# Patient Record
Sex: Female | Born: 1950 | Race: White | Marital: Married | Smoking: Never smoker
Health system: Southern US, Community
[De-identification: ages and names within clinical notes are randomized; demographics above are authoritative.]

## PROBLEM LIST (undated history)

## (undated) DIAGNOSIS — F32A Depression, unspecified: Secondary | ICD-10-CM

## (undated) DIAGNOSIS — I499 Cardiac arrhythmia, unspecified: Secondary | ICD-10-CM

## (undated) DIAGNOSIS — R42 Dizziness and giddiness: Secondary | ICD-10-CM

## (undated) DIAGNOSIS — A4902 Methicillin resistant Staphylococcus aureus infection, unspecified site: Secondary | ICD-10-CM

## (undated) DIAGNOSIS — F419 Anxiety disorder, unspecified: Secondary | ICD-10-CM

## (undated) DIAGNOSIS — F329 Major depressive disorder, single episode, unspecified: Secondary | ICD-10-CM

## (undated) DIAGNOSIS — M797 Fibromyalgia: Secondary | ICD-10-CM

## (undated) DIAGNOSIS — D649 Anemia, unspecified: Secondary | ICD-10-CM

## (undated) HISTORY — PX: ABDOMINAL HYSTERECTOMY: SHX81

## (undated) HISTORY — PX: HAND SURGERY: SHX662

## (undated) HISTORY — PX: INCONTINENCE SURGERY: SHX676

## (undated) HISTORY — PX: DENTAL SURGERY: SHX609

---

## 2013-03-11 ENCOUNTER — Other Ambulatory Visit: Payer: Self-pay | Admitting: Physical Medicine and Rehabilitation

## 2013-03-11 DIAGNOSIS — M5136 Other intervertebral disc degeneration, lumbar region: Secondary | ICD-10-CM

## 2013-03-11 DIAGNOSIS — M545 Low back pain, unspecified: Secondary | ICD-10-CM

## 2013-03-12 ENCOUNTER — Other Ambulatory Visit: Payer: Self-pay | Admitting: Physical Medicine and Rehabilitation

## 2013-03-12 DIAGNOSIS — Z139 Encounter for screening, unspecified: Secondary | ICD-10-CM

## 2013-03-16 ENCOUNTER — Other Ambulatory Visit: Payer: Self-pay

## 2013-03-20 ENCOUNTER — Ambulatory Visit
Admission: RE | Admit: 2013-03-20 | Discharge: 2013-03-20 | Disposition: A | Discharge status: Home / Self Care | Payer: Federal, State, Local not specified - PPO | Source: Ambulatory Visit | Attending: Physical Medicine and Rehabilitation | Admitting: Physical Medicine and Rehabilitation

## 2013-03-20 DIAGNOSIS — M545 Low back pain, unspecified: Secondary | ICD-10-CM

## 2013-03-20 DIAGNOSIS — Z139 Encounter for screening, unspecified: Secondary | ICD-10-CM

## 2013-03-20 DIAGNOSIS — M5136 Other intervertebral disc degeneration, lumbar region: Secondary | ICD-10-CM

## 2014-10-17 IMAGING — CR DG ORBITS FOR FOREIGN BODY
2 series · 2 of 2 positions shown · non-contrast
Comparison: None

CLINICAL DATA: Pre MRI.  History of exposure to metal.

ORBITS FOR FOREIGN BODY - 2 VIEW

[view not recorded (1 of 2)]
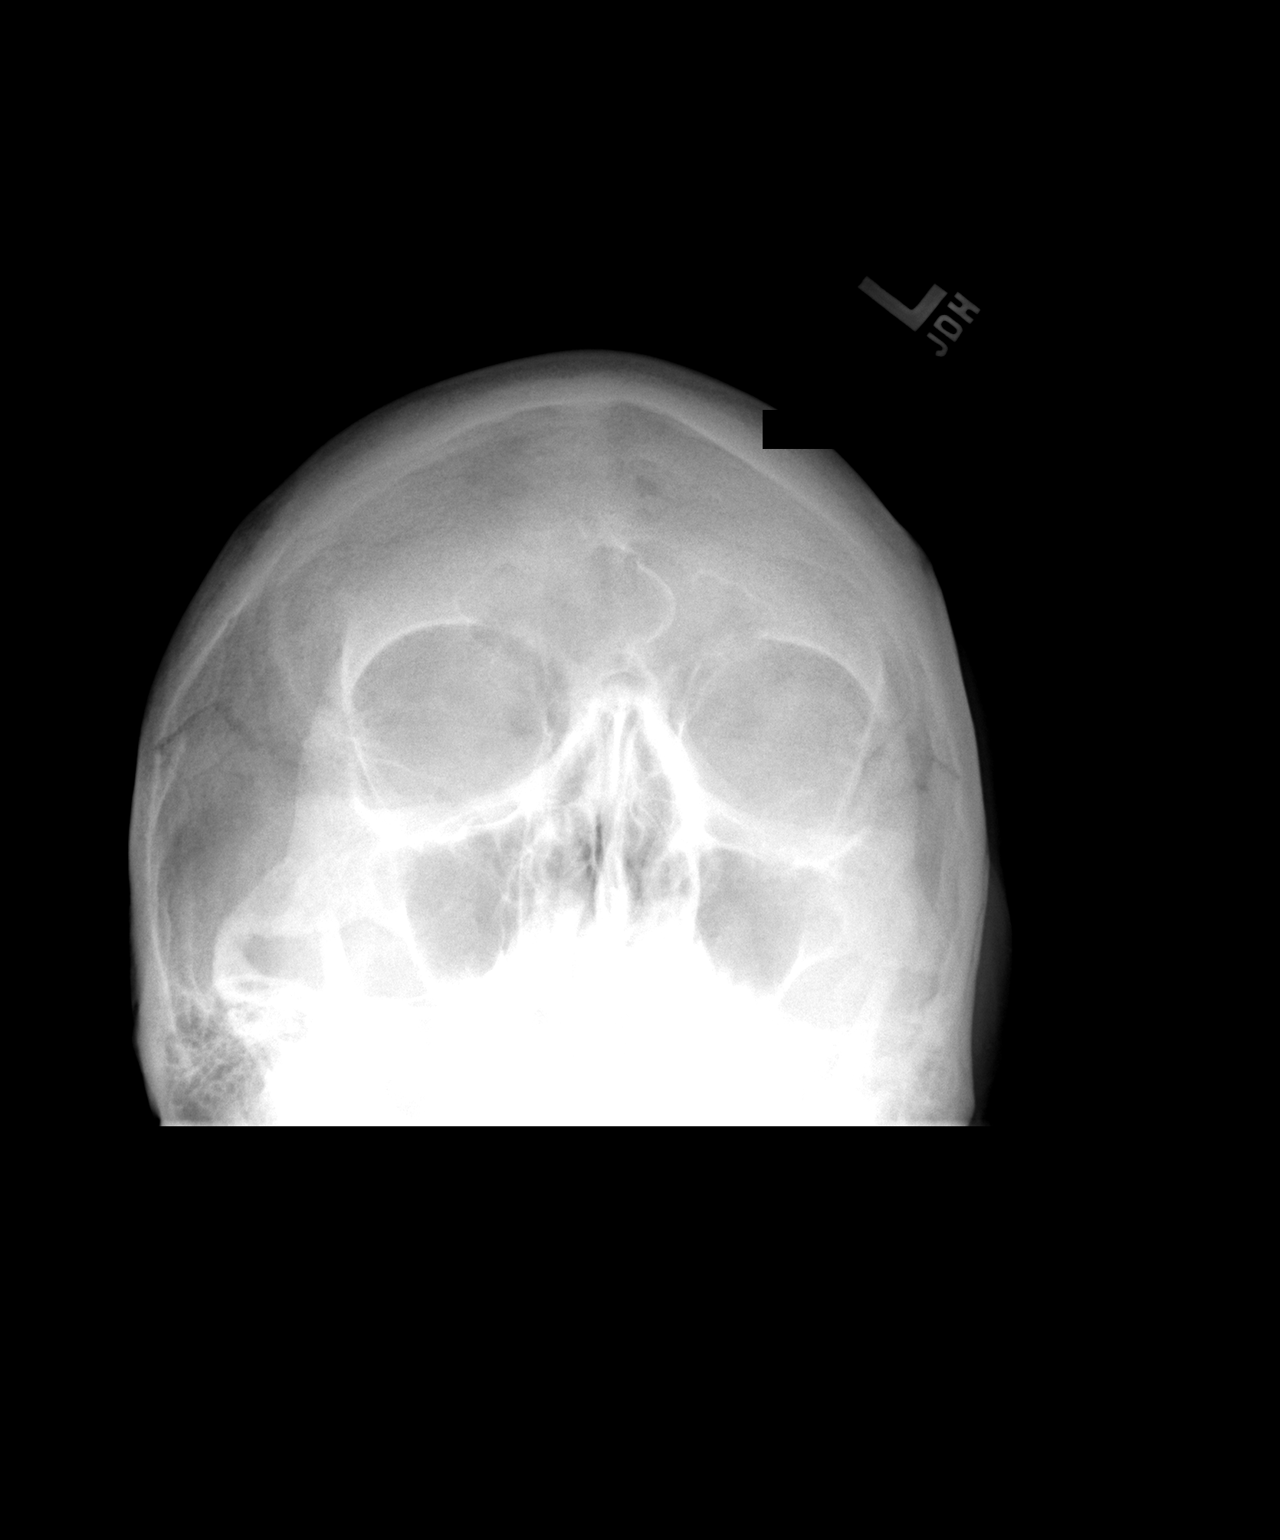

[view not recorded (2 of 2)]
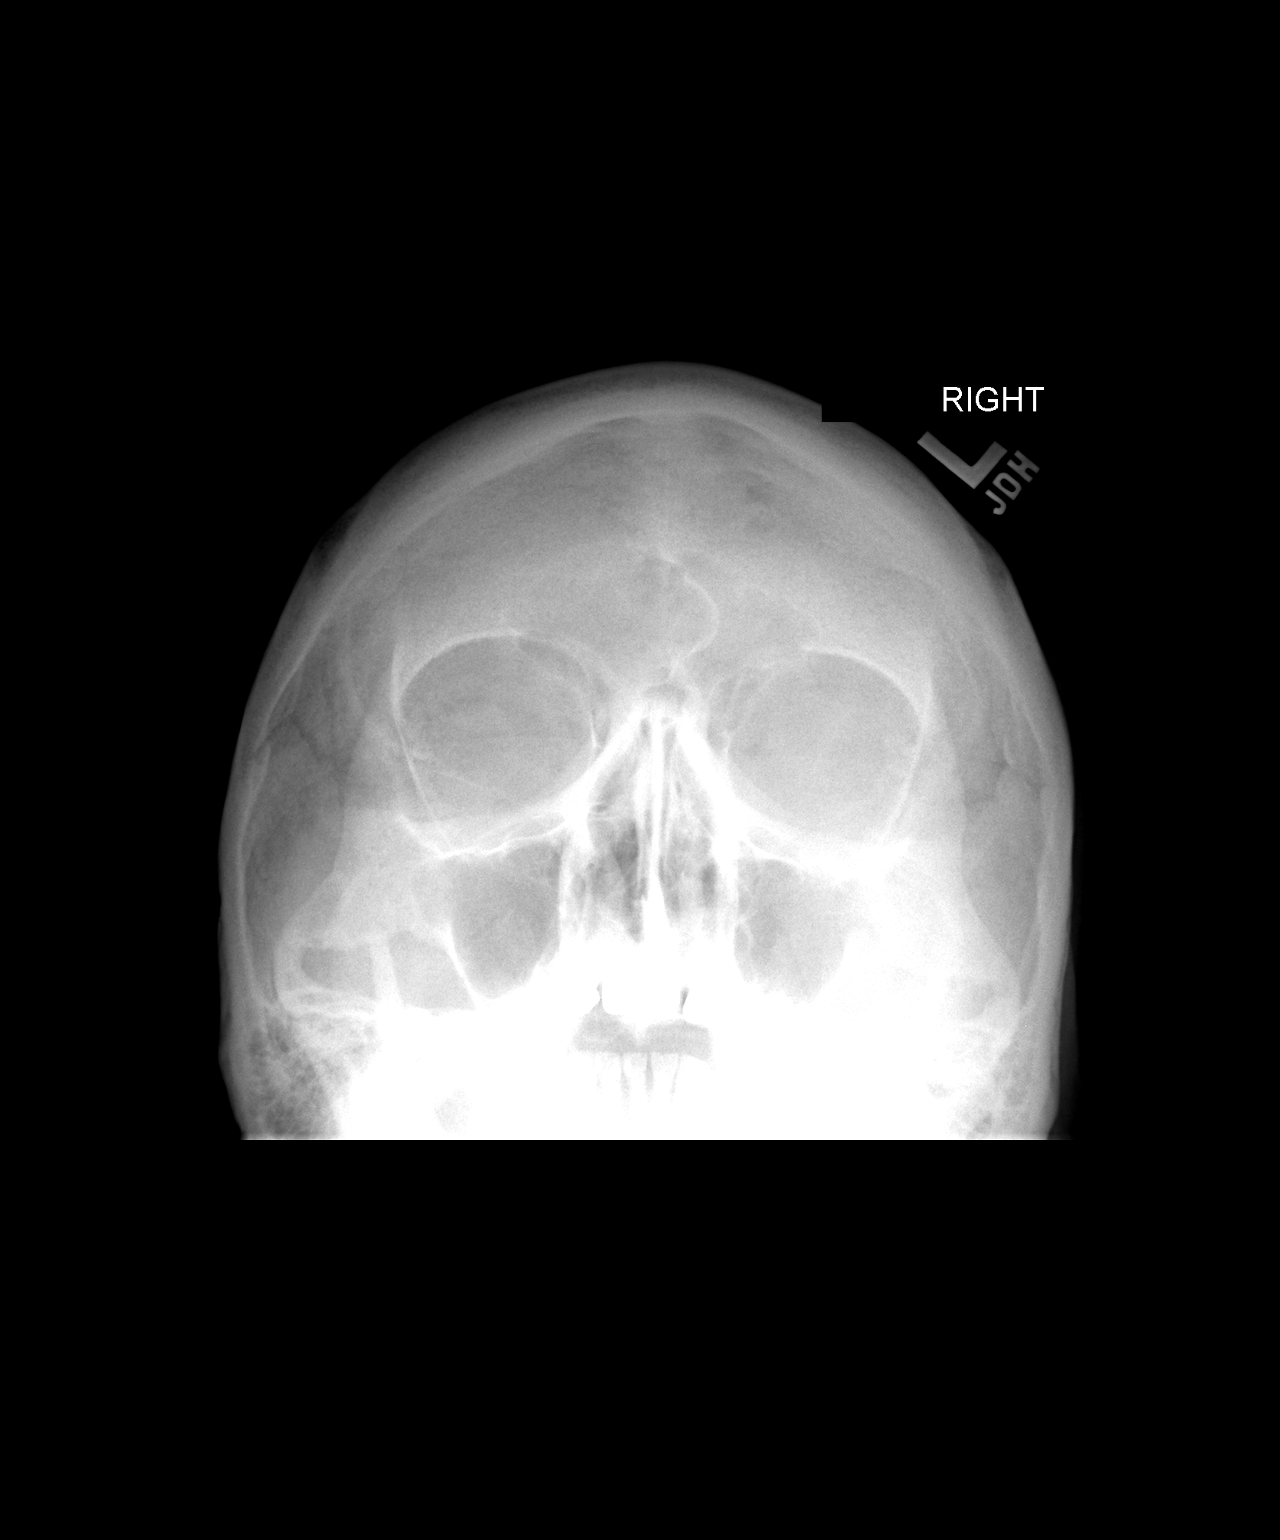

[2 of 2 positions shown; findings below may reference images not displayed]

FINDINGS: No evidence of radiopaque metallic foreign body
projecting over the orbits.  Visualized bony structures
unremarkable.
IMPRESSION: No evidence of radiopaque foreign body within the orbits.

## 2018-01-08 ENCOUNTER — Other Ambulatory Visit: Payer: Self-pay | Admitting: Orthopedic Surgery

## 2018-01-30 ENCOUNTER — Encounter (HOSPITAL_BASED_OUTPATIENT_CLINIC_OR_DEPARTMENT_OTHER)
Admission: RE | Disposition: A | Discharge status: Home / Self Care | Payer: Self-pay | Source: Ambulatory Visit | Attending: Orthopedic Surgery

## 2018-01-30 ENCOUNTER — Encounter (HOSPITAL_BASED_OUTPATIENT_CLINIC_OR_DEPARTMENT_OTHER): Payer: Self-pay | Admitting: *Deleted

## 2018-01-30 ENCOUNTER — Ambulatory Visit (HOSPITAL_BASED_OUTPATIENT_CLINIC_OR_DEPARTMENT_OTHER)
Admission: RE | Admit: 2018-01-30 | Discharge: 2018-01-30 | Disposition: A | Discharge status: Home / Self Care | Payer: Medicare Other | Source: Ambulatory Visit | Attending: Orthopedic Surgery | Admitting: Orthopedic Surgery

## 2018-01-30 ENCOUNTER — Ambulatory Visit (HOSPITAL_BASED_OUTPATIENT_CLINIC_OR_DEPARTMENT_OTHER): Payer: Medicare Other | Admitting: Anesthesiology

## 2018-01-30 ENCOUNTER — Other Ambulatory Visit: Payer: Self-pay

## 2018-01-30 DIAGNOSIS — R42 Dizziness and giddiness: Secondary | ICD-10-CM | POA: Diagnosis not present

## 2018-01-30 DIAGNOSIS — Z6833 Body mass index (BMI) 33.0-33.9, adult: Secondary | ICD-10-CM | POA: Insufficient documentation

## 2018-01-30 DIAGNOSIS — M797 Fibromyalgia: Secondary | ICD-10-CM | POA: Insufficient documentation

## 2018-01-30 DIAGNOSIS — G5601 Carpal tunnel syndrome, right upper limb: Secondary | ICD-10-CM | POA: Diagnosis not present

## 2018-01-30 DIAGNOSIS — F418 Other specified anxiety disorders: Secondary | ICD-10-CM | POA: Diagnosis not present

## 2018-01-30 DIAGNOSIS — M180 Bilateral primary osteoarthritis of first carpometacarpal joints: Secondary | ICD-10-CM | POA: Insufficient documentation

## 2018-01-30 DIAGNOSIS — M19041 Primary osteoarthritis, right hand: Secondary | ICD-10-CM | POA: Insufficient documentation

## 2018-01-30 DIAGNOSIS — E669 Obesity, unspecified: Secondary | ICD-10-CM | POA: Insufficient documentation

## 2018-01-30 HISTORY — DX: Dizziness and giddiness: R42

## 2018-01-30 HISTORY — DX: Major depressive disorder, single episode, unspecified: F32.9

## 2018-01-30 HISTORY — DX: Cardiac arrhythmia, unspecified: I49.9

## 2018-01-30 HISTORY — PX: CARPAL TUNNEL RELEASE: SHX101

## 2018-01-30 HISTORY — DX: Anxiety disorder, unspecified: F41.9

## 2018-01-30 HISTORY — DX: Anemia, unspecified: D64.9

## 2018-01-30 HISTORY — DX: Depression, unspecified: F32.A

## 2018-01-30 HISTORY — DX: Methicillin resistant Staphylococcus aureus infection, unspecified site: A49.02

## 2018-01-30 HISTORY — DX: Fibromyalgia: M79.7

## 2018-01-30 SURGERY — CARPAL TUNNEL RELEASE
Anesthesia: Regional | Site: Wrist | Laterality: Right

## 2018-01-30 MED ORDER — LIDOCAINE HCL (PF) 0.5 % IJ SOLN
INTRAMUSCULAR | Status: DC | PRN
  Administered 2018-01-30: 35 mg via INTRAVENOUS

## 2018-01-30 MED ORDER — 0.9 % SODIUM CHLORIDE (POUR BTL) OPTIME
TOPICAL | Status: DC | PRN
  Administered 2018-01-30: 100 mL

## 2018-01-30 MED ORDER — FENTANYL CITRATE (PF) 100 MCG/2ML IJ SOLN: 25.0000 ug | INTRAMUSCULAR | Status: DC | PRN

## 2018-01-30 MED ORDER — FENTANYL CITRATE (PF) 100 MCG/2ML IJ SOLN
50.0000 ug | INTRAMUSCULAR | Status: DC | PRN
  Administered 2018-01-30: 50 ug via INTRAVENOUS

## 2018-01-30 MED ORDER — FENTANYL CITRATE (PF) 100 MCG/2ML IJ SOLN
INTRAMUSCULAR | Status: AC
  Filled 2018-01-30: qty 2

## 2018-01-30 MED ORDER — BUPIVACAINE HCL (PF) 0.25 % IJ SOLN
INTRAMUSCULAR | Status: DC | PRN
  Administered 2018-01-30: 8 mL

## 2018-01-30 MED ORDER — ONDANSETRON HCL 4 MG/2ML IJ SOLN
INTRAMUSCULAR | Status: DC | PRN
  Administered 2018-01-30: 4 mg via INTRAVENOUS

## 2018-01-30 MED ORDER — CHLORHEXIDINE GLUCONATE 4 % EX LIQD: 60.0000 mL | Freq: Once | CUTANEOUS | Status: DC

## 2018-01-30 MED ORDER — ONDANSETRON HCL 4 MG/2ML IJ SOLN
INTRAMUSCULAR | Status: AC
  Filled 2018-01-30: qty 2

## 2018-01-30 MED ORDER — ONDANSETRON HCL 4 MG/2ML IJ SOLN: 4.0000 mg | Freq: Once | INTRAMUSCULAR | Status: DC | PRN

## 2018-01-30 MED ORDER — PROPOFOL 500 MG/50ML IV EMUL
INTRAVENOUS | Status: DC | PRN
  Administered 2018-01-30: 75 ug/kg/min via INTRAVENOUS

## 2018-01-30 MED ORDER — LACTATED RINGERS IV SOLN
INTRAVENOUS | Status: DC
  Administered 2018-01-30: 10:00:00 via INTRAVENOUS

## 2018-01-30 MED ORDER — CEFAZOLIN SODIUM-DEXTROSE 2-4 GM/100ML-% IV SOLN: 2.0000 g | INTRAVENOUS | Status: DC

## 2018-01-30 MED ORDER — HYDROCODONE-ACETAMINOPHEN 5-325 MG PO TABS: 1.0000 | ORAL_TABLET | Freq: Four times a day (QID) | ORAL | 0 refills | Status: AC | PRN

## 2018-01-30 MED ORDER — CEFAZOLIN SODIUM-DEXTROSE 2-4 GM/100ML-% IV SOLN
INTRAVENOUS | Status: AC
  Filled 2018-01-30: qty 100

## 2018-01-30 MED ORDER — MIDAZOLAM HCL 2 MG/2ML IJ SOLN
INTRAMUSCULAR | Status: AC
  Filled 2018-01-30: qty 2

## 2018-01-30 MED ORDER — OXYCODONE HCL 5 MG PO TABS: 5.0000 mg | ORAL_TABLET | Freq: Once | ORAL | Status: DC | PRN

## 2018-01-30 MED ORDER — MIDAZOLAM HCL 2 MG/2ML IJ SOLN
1.0000 mg | INTRAMUSCULAR | Status: DC | PRN
  Administered 2018-01-30: 1 mg via INTRAVENOUS

## 2018-01-30 MED ORDER — SCOPOLAMINE 1 MG/3DAYS TD PT72: 1.0000 | MEDICATED_PATCH | Freq: Once | TRANSDERMAL | Status: DC | PRN

## 2018-01-30 MED ORDER — OXYCODONE HCL 5 MG/5ML PO SOLN: 5.0000 mg | Freq: Once | ORAL | Status: DC | PRN

## 2018-01-30 MED ORDER — LIDOCAINE HCL (CARDIAC) 20 MG/ML IV SOLN
INTRAVENOUS | Status: DC | PRN
  Administered 2018-01-30: 50 mg via INTRAVENOUS

## 2018-01-30 SURGICAL SUPPLY — 38 items
BLADE SURG 15 STRL LF DISP TIS (BLADE) ×1 IMPLANT
BLADE SURG 15 STRL SS (BLADE) ×1
BNDG COHESIVE 3X5 TAN STRL LF (GAUZE/BANDAGES/DRESSINGS) ×2 IMPLANT
BNDG ESMARK 4X9 LF (GAUZE/BANDAGES/DRESSINGS) ×2 IMPLANT
BNDG GAUZE ELAST 4 BULKY (GAUZE/BANDAGES/DRESSINGS) ×2 IMPLANT
CHLORAPREP W/TINT 26ML (MISCELLANEOUS) ×2 IMPLANT
CORD BIPOLAR FORCEPS 12FT (ELECTRODE) ×2 IMPLANT
COVER BACK TABLE 60X90IN (DRAPES) ×2 IMPLANT
COVER MAYO STAND STRL (DRAPES) ×2 IMPLANT
CUFF TOURNIQUET SINGLE 18IN (TOURNIQUET CUFF) ×2 IMPLANT
DRAPE EXTREMITY T 121X128X90 (DRAPE) ×2 IMPLANT
DRAPE SURG 17X23 STRL (DRAPES) ×2 IMPLANT
DRSG PAD ABDOMINAL 8X10 ST (GAUZE/BANDAGES/DRESSINGS) ×2 IMPLANT
GAUZE SPONGE 4X4 12PLY STRL (GAUZE/BANDAGES/DRESSINGS) ×2 IMPLANT
GAUZE XEROFORM 1X8 LF (GAUZE/BANDAGES/DRESSINGS) ×2 IMPLANT
GLOVE BIOGEL PI IND STRL 7.0 (GLOVE) ×2 IMPLANT
GLOVE BIOGEL PI IND STRL 7.5 (GLOVE) ×1 IMPLANT
GLOVE BIOGEL PI IND STRL 8.5 (GLOVE) ×1 IMPLANT
GLOVE BIOGEL PI INDICATOR 7.0 (GLOVE) ×2
GLOVE BIOGEL PI INDICATOR 7.5 (GLOVE) ×1
GLOVE BIOGEL PI INDICATOR 8.5 (GLOVE) ×1
GLOVE ECLIPSE 6.5 STRL STRAW (GLOVE) ×2 IMPLANT
GLOVE SURG ORTHO 8.0 STRL STRW (GLOVE) ×2 IMPLANT
GLOVE SURG SS PI 7.5 STRL IVOR (GLOVE) ×2 IMPLANT
GOWN STRL REUS W/ TWL LRG LVL3 (GOWN DISPOSABLE) ×1 IMPLANT
GOWN STRL REUS W/ TWL XL LVL3 (GOWN DISPOSABLE) ×1 IMPLANT
GOWN STRL REUS W/TWL LRG LVL3 (GOWN DISPOSABLE) ×1
GOWN STRL REUS W/TWL XL LVL3 (GOWN DISPOSABLE) ×3 IMPLANT
NEEDLE PRECISIONGLIDE 27X1.5 (NEEDLE) ×2 IMPLANT
NS IRRIG 1000ML POUR BTL (IV SOLUTION) ×2 IMPLANT
PACK BASIN DAY SURGERY FS (CUSTOM PROCEDURE TRAY) ×2 IMPLANT
STOCKINETTE 4X48 STRL (DRAPES) ×2 IMPLANT
SUT ETHILON 4 0 PS 2 18 (SUTURE) ×2 IMPLANT
SUT VICRYL 4-0 PS2 18IN ABS (SUTURE) IMPLANT
SYR BULB 3OZ (MISCELLANEOUS) ×2 IMPLANT
SYR CONTROL 10ML LL (SYRINGE) ×2 IMPLANT
TOWEL OR 17X24 6PK STRL BLUE (TOWEL DISPOSABLE) ×2 IMPLANT
UNDERPAD 30X30 (UNDERPADS AND DIAPERS) ×2 IMPLANT

## 2018-01-30 NOTE — Op Note (Signed)
Other Dictation: Dictation Number [id number]

## 2018-01-30 NOTE — Anesthesia Preprocedure Evaluation (Addendum)
Anesthesia Evaluation  Patient identified by MRN, date of birth, ID band Patient awake    Reviewed: Allergy & Precautions, NPO status , Patient's Chart, lab work & pertinent test results, reviewed documented beta blocker date and time   Airway Mallampati: I  TM Distance: >3 FB Neck ROM: Full    Dental  (+) Dental Advisory Given   Pulmonary neg pulmonary ROS,    Pulmonary exam normal breath sounds clear to auscultation       Cardiovascular Normal cardiovascular exam+ dysrhythmias  Rhythm:Regular Rate:Normal     Neuro/Psych Anxiety Depression Vertigo    GI/Hepatic negative GI ROS, Neg liver ROS,   Endo/Other  Obesity  Renal/GU negative Renal ROS  negative genitourinary   Musculoskeletal  (+) Arthritis , Fibromyalgia -  Abdominal (+) + obese,   Peds  Hematology negative hematology ROS (+)   Anesthesia Other Findings   Reproductive/Obstetrics                           Anesthesia Physical Anesthesia Plan  ASA: II  Anesthesia Plan: Bier Block and Bier Block-LIDOCAINE ONLY   Post-op Pain Management:    Induction: Intravenous  PONV Risk Score and Plan: 2 and Propofol infusion and Treatment may vary due to age or medical condition  Airway Management Planned: Natural Airway and Mask  Additional Equipment: None  Intra-op Plan:   Post-operative Plan:   Informed Consent: I have reviewed the patients History and Physical, chart, labs and discussed the procedure including the risks, benefits and alternatives for the proposed anesthesia with the patient or authorized representative who has indicated his/her understanding and acceptance.     Plan Discussed with: CRNA, Anesthesiologist and Surgeon  Anesthesia Plan Comments:        Anesthesia Quick Evaluation

## 2018-01-30 NOTE — Op Note (Signed)
NAMSheryn Bison:  HALL, Hava                   ACCOUNT NO.:  [id number]  MEDICAL RECORD NO.:  [id number]  LOCATION:                                 FACILITY:  PHYSICIAN:  Cindee SaltGary Makyah Lavigne, M.D.            DATE OF BIRTH:  DATE OF PROCEDURE:  01/30/2018 DATE OF DISCHARGE:                              OPERATIVE REPORT   PREOPERATIVE DIAGNOSIS:  Carpal tunnel syndrome, right hand.  POSTOPERATIVE DIAGNOSIS:  Carpal tunnel syndrome, right hand.  OPERATION:  Decompression, right median nerve.  SURGEON:  Cindee SaltGary Felesia Stahlecker, MD.  ASSISTANT:  None.  ANESTHESIA:  Forearm IV regional with IV sedation.  [facility]  ANESTHESIOLOGIST:  [place].  HISTORY:  The patient is a 67 year old female with a history of bilateral carpal tunnel syndrome and EMG nerve conductions positive, which has not responded to conservative treatment.  She has elected to undergo surgical decompression to the median nerve of right hand.  Pre, peri, and postoperative course have been discussed along with risks and complications.  She is aware that there is no guarantee to the surgery, the possibility of infection, recurrence of injury to arteries, nerves, and tendons, incomplete relief of symptoms, and dystrophy.  In the preoperative area, the patient is seen, the extremity marked by both patient and surgeon, antibiotic given.  DESCRIPTION OF PROCEDURE:  The patient was brought to the operating room where a forearm-based IV regional anesthetic was carried out without difficulty.  She was prepped using ChloraPrep in supine position with the right arm free.  A 3-minute dry time was allowed and time-out was taken confirming the patient and procedure.  A longitudinal incision was made in the right palm and carried down through subcutaneous tissue. Bleeders were electrocauterized with bipolar.  The palmar fascia was split.  The superficial palmar arch was identified.  The flexor tendon to the ring and little  finger was identified.  Retractors were placed retracting median nerve radially and ulnar nerve ulnarly.  Flexor retinaculum was then incised with sharp dissection on its ulnar border. A right angle and Sewell retractor were placed between skin and forearm fascia.  Deep structure was dissected free with blunt dissection.  The flexor retinaculum on proximal aspect of the distal forearm fascia was then released for approximately 2-3 cm proximal to the wrist crease under direct vision.  The canal was explored.  An area of compression to the nerve was apparent.  A persistent median artery was present.  No further lesions were identified.  The wound was copiously irrigated with saline.  The skin was closed with interrupted 4-0 nylon sutures.  Local infiltration with 0.25% bupivacaine without epinephrine was given, approximately 8 mL was used.  A sterile compressive dressing with the fingers free was applied.  On deflation of the tourniquet, all fingers immediately pinked.  She was taken to the recovery room for observation in satisfactory condition.  She will be discharged to home, [facility] [place].          ______________________________ Cindee SaltGary Gissella Niblack, M.D.     GK/MEDQ  D:  01/30/2018  T:  01/30/2018  Job:  [id number]

## 2018-01-30 NOTE — Anesthesia Postprocedure Evaluation (Signed)
Anesthesia Post Note  Patient: Phyllis Anderson  Procedure(s) Performed: RIGHT CARPAL TUNNEL RELEASE (Right Wrist)     Patient location during evaluation: PACU Anesthesia Type: Bier Block Level of consciousness: awake and alert Pain management: pain level controlled Vital Signs Assessment: post-procedure vital signs reviewed and stable Respiratory status: spontaneous breathing, nonlabored ventilation and respiratory function stable Cardiovascular status: stable and blood pressure returned to baseline Postop Assessment: no apparent nausea or vomiting Anesthetic complications: no    Last Vitals:  Vitals:   01/30/18 1100 01/30/18 1109  BP: (!) 148/80 (!) 151/67  Pulse: 72 72  Resp: 14 18  Temp:  36.5 C  SpO2: 98% 97%    Last Pain:  Vitals:   01/30/18 1109  TempSrc:   PainSc: 2                  Beryle Lathehomas E Alila Sotero

## 2018-01-30 NOTE — Brief Op Note (Signed)
01/30/2018  10:44 AM  PATIENT:  Morton PetersJudy Standley  67 y.o. female  PRE-OPERATIVE DIAGNOSIS:  RIGHT CARPAL TUNNEL SYNDROME  POST-OPERATIVE DIAGNOSIS:  RIGHT CARPAL TUNNEL SYNDROME  PROCEDURE:  Procedure(s): RIGHT CARPAL TUNNEL RELEASE (Right)  SURGEON:  Surgeon(s) and Role:    * Cindee SaltKuzma, Alwyn Cordner, MD - Primary  PHYSICIAN ASSISTANT:   ASSISTANTS: none   ANESTHESIA:   local, regional and IV sedation  EBL:  noneBLOOD ADMINISTERED:none  DRAINS: none   LOCAL MEDICATIONS USED:  BUPIVICAINE   SPECIMEN:  No Specimen  DISPOSITION OF SPECIMEN:  N/A  COUNTS:  YES  TOURNIQUET:   Total Tourniquet Time Documented: Forearm (Right) - 20 minutes Total: Forearm (Right) - 20 minutes   DICTATION: .Other Dictation: Dictation Number [id number]  PLAN OF CARE: Discharge to home after PACU  PATIENT DISPOSITION:  PACU - hemodynamically stable.

## 2018-01-30 NOTE — Discharge Instructions (Addendum)

## 2018-01-30 NOTE — H&P (Signed)
Phyllis PetersJudy Anderson is an 67 y.o. female.   Chief Complaint: numbness right hand HPI: Phyllis HongJudy is a 67yo female with bilateral carpal tunnel syndrome arthritis CMC joints multiple digits. He was last seen 07/12/2017. Complaining of both hands. She was placed on a Medrol Dosepak at that time which gave her relief until recently. States that it is gotten progressively worse. She is dropping things with numbness and tingling and pain in her fingers and thumb. He is seen with her daughter at this time. Has no new injury. He has a history of arthritis. She has no history of diabetes thyroid problems or gout. Family history is negative for these. Ultrasound did not reveal carpal tunnel syndrome bilaterally nerve conductions were not able to be tolerated by the patient. Were done by Dr. Riccardo DubinKarvelas.     No past medical history on file.  pertinent history in HPI No family history on file. Social History:  has no tobacco, alcohol, and drug history on file.  Allergies: Allergies not on file  No medications prior to admission.    No results found for this or any previous visit (from the past 48 hour(s)).  No results found.   Pertinent items are noted in HPI.  There were no vitals taken for this visit.  General appearance: alert, cooperative and appears stated age Head: Normocephalic, without obvious abnormality, atraumatic Neck: no JVD Resp: clear to auscultation bilaterally Cardio: regular rate and rhythm, S1, S2 normal, no murmur, click, rub or gallop GI: soft, non-tender; bowel sounds normal; no masses,  no organomegaly Extremities: numbness right hand Pulses: 2+ and symmetric Skin: Skin color, texture, turgor normal. No rashes or lesions Neurologic: Grossly normal Incision/Wound: na  Assessment/Plan Assessment:  1. Bilateral carpal tunnel syndrome  2. Primary osteoarthritis of both first carpometacarpal joints  3. Osteoarthritis of finger of right hand     Plan: Had a long discussion with her  and her daughter regarding treatment alternatives. She would like to proceed with carpal tunnel release on her right side. Pre-peri-postoperative course are discussed along with risks and complications. They are aware that this will not resolve symptoms and the arthritis for her. I will not schedule a CMC arthroplasty arthroplasty of the fingers at the present time her carpal tunnel release right side can be done as an outpatient under regional anesthesia.      Phyllis Anderson 01/30/2018, 5:01 AM

## 2018-01-30 NOTE — Transfer of Care (Signed)
Immediate Anesthesia Transfer of Care Note  Patient: Phyllis PetersJudy Anderson  Procedure(s) Performed: RIGHT CARPAL TUNNEL RELEASE (Right Wrist)  Patient Location: PACU  Anesthesia Type:Bier block  Level of Consciousness: awake, oriented and patient cooperative  Airway & Oxygen Therapy: Patient Spontanous Breathing and Patient connected to face mask oxygen  Post-op Assessment: Report given to RN and Post -op Vital signs reviewed and stable  Post vital signs: Reviewed and stable  Last Vitals:  Vitals Value Taken Time  BP    Temp    Pulse 73 01/30/2018 10:38 AM  Resp    SpO2 97 % 01/30/2018 10:38 AM  Vitals shown include unvalidated device data.  Last Pain:  Vitals:   01/30/18 0922  TempSrc: Oral  PainSc: 4       Patients Stated Pain Goal: 2 (01/30/18 [id number])  Complications: No apparent anesthesia complications

## 2018-01-31 ENCOUNTER — Encounter (HOSPITAL_BASED_OUTPATIENT_CLINIC_OR_DEPARTMENT_OTHER): Payer: Self-pay | Admitting: Orthopedic Surgery
